# Patient Record
Sex: Male | Born: 1975 | Race: Black or African American | Hispanic: No | Marital: Single | Smoking: Former smoker
Health system: Southern US, Community
[De-identification: ages and names within clinical notes are randomized; demographics above are authoritative.]

## PROBLEM LIST (undated history)

## (undated) DIAGNOSIS — M549 Dorsalgia, unspecified: Secondary | ICD-10-CM

---

## 2014-04-23 ENCOUNTER — Encounter (HOSPITAL_COMMUNITY): Payer: Self-pay | Admitting: Emergency Medicine

## 2014-04-23 ENCOUNTER — Emergency Department (HOSPITAL_COMMUNITY)
Admission: EM | Admit: 2014-04-23 | Discharge: 2014-04-23 | Discharge status: Against Medical Advice | Payer: Medicaid Other | Attending: Emergency Medicine | Admitting: Emergency Medicine

## 2014-04-23 DIAGNOSIS — M545 Low back pain, unspecified: Secondary | ICD-10-CM | POA: Diagnosis present

## 2014-04-23 DIAGNOSIS — R209 Unspecified disturbances of skin sensation: Secondary | ICD-10-CM | POA: Insufficient documentation

## 2014-04-23 DIAGNOSIS — R369 Urethral discharge, unspecified: Secondary | ICD-10-CM | POA: Diagnosis not present

## 2014-04-23 DIAGNOSIS — J029 Acute pharyngitis, unspecified: Secondary | ICD-10-CM | POA: Insufficient documentation

## 2014-04-23 DIAGNOSIS — Z202 Contact with and (suspected) exposure to infections with a predominantly sexual mode of transmission: Secondary | ICD-10-CM | POA: Insufficient documentation

## 2014-04-23 DIAGNOSIS — R300 Dysuria: Secondary | ICD-10-CM | POA: Diagnosis not present

## 2014-04-23 DIAGNOSIS — M5430 Sciatica, unspecified side: Secondary | ICD-10-CM | POA: Insufficient documentation

## 2014-04-23 DIAGNOSIS — Z87891 Personal history of nicotine dependence: Secondary | ICD-10-CM | POA: Diagnosis not present

## 2014-04-23 DIAGNOSIS — R350 Frequency of micturition: Secondary | ICD-10-CM | POA: Diagnosis not present

## 2014-04-23 DIAGNOSIS — G8929 Other chronic pain: Secondary | ICD-10-CM | POA: Diagnosis not present

## 2014-04-23 DIAGNOSIS — D72829 Elevated white blood cell count, unspecified: Secondary | ICD-10-CM | POA: Insufficient documentation

## 2014-04-23 DIAGNOSIS — Z87828 Personal history of other (healed) physical injury and trauma: Secondary | ICD-10-CM | POA: Diagnosis not present

## 2014-04-23 DIAGNOSIS — M5431 Sciatica, right side: Secondary | ICD-10-CM

## 2014-04-23 LAB — HIV ANTIBODY (ROUTINE TESTING W REFLEX): HIV 1&2 Ab, 4th Generation: NONREACTIVE

## 2014-04-23 LAB — URINE MICROSCOPIC-ADD ON

## 2014-04-23 LAB — URINALYSIS, ROUTINE W REFLEX MICROSCOPIC
Bilirubin Urine: NEGATIVE
Glucose, UA: NEGATIVE mg/dL
Hgb urine dipstick: NEGATIVE
Ketones, ur: NEGATIVE mg/dL
Nitrite: NEGATIVE
PROTEIN: NEGATIVE mg/dL
Specific Gravity, Urine: 1.026 (ref 1.005–1.030)
Urobilinogen, UA: 1 mg/dL (ref 0.0–1.0)
pH: 5.5 (ref 5.0–8.0)

## 2014-04-23 LAB — RPR

## 2014-04-23 MED ORDER — AZITHROMYCIN 250 MG PO TABS
1000.0000 mg | ORAL_TABLET | Freq: Once | ORAL | Status: AC
  Administered 2014-04-23: 1000 mg via ORAL
  Filled 2014-04-23: qty 4

## 2014-04-23 MED ORDER — DOXYCYCLINE HYCLATE 100 MG PO CAPS: 100.0000 mg | ORAL_CAPSULE | Freq: Two times a day (BID) | ORAL | Status: DC

## 2014-04-23 MED ORDER — CEFTRIAXONE SODIUM 250 MG IJ SOLR
250.0000 mg | Freq: Once | INTRAMUSCULAR | Status: AC
  Administered 2014-04-23: 250 mg via INTRAMUSCULAR
  Filled 2014-04-23: qty 250

## 2014-04-23 MED ORDER — LIDOCAINE HCL (PF) 1 % IJ SOLN
INTRAMUSCULAR | Status: AC
  Administered 2014-04-23: 5 mL
  Filled 2014-04-23: qty 5

## 2014-04-23 NOTE — ED Notes (Signed)
Pt presents with lower back pain radiating down into his Right leg, pt states "all of my life" when asked how long has the back pain been going on. Pt also reports burning with urination a penile discharge x3 days. Pt ambulatory in triage with a steady gait

## 2014-04-23 NOTE — ED Notes (Signed)
Pt states he does not have time to stay for scan. Has to leave AMA.

## 2014-04-23 NOTE — ED Notes (Signed)
Pt left without papers.  Had to go.

## 2014-04-23 NOTE — Discharge Instructions (Signed)
Please call your doctor for a followup appointment within 24-48 hours. When you talk to your doctor please let them know that you were seen in the emergency department and have them acquire all of your records so that they can discuss the findings with you and formulate a treatment plan to fully care for your new and ongoing problems. Please call and set up an appointment with health department Please take antibiotics as prescribed Please report back to the ED to get an x-ray and further imaging performed Please continue to monitor symptoms closely if symptoms are to worsen or change (fever greater than 101, chills, chest pain, shortness of breath, difficulty breathing, stomach pain, nausea, vomiting, diarrhea, worsening or changes to pain pattern, fall, injury, loss of sensation, numbness, tingling, fall, injury, urinary and bowel incontinence) please report back to the ED immediately   Sexually Transmitted Disease A sexually transmitted disease (STD) is a disease or infection that may be passed (transmitted) from person to person, usually during sexual activity. This may happen by way of saliva, semen, blood, vaginal mucus, or urine. Common STDs include:   Gonorrhea.   Chlamydia.   Syphilis.   HIV and AIDS.   Genital herpes.   Hepatitis B and C.   Trichomonas.   Human papillomavirus (HPV).   Pubic lice.   Scabies.  Mites.  Bacterial vaginosis. WHAT ARE CAUSES OF STDs? An STD may be caused by bacteria, a virus, or parasites. STDs are often transmitted during sexual activity if one person is infected. However, they may also be transmitted through nonsexual means. STDs may be transmitted after:   Sexual intercourse with an infected person.   Sharing sex toys with an infected person.   Sharing needles with an infected person or using unclean piercing or tattoo needles.  Having intimate contact with the genitals, mouth, or rectal areas of an infected person.    Exposure to infected fluids during birth. WHAT ARE THE SIGNS AND SYMPTOMS OF STDs? Different STDs have different symptoms. Some people may not have any symptoms. If symptoms are present, they may include:   Painful or bloody urination.   Pain in the pelvis, abdomen, vagina, anus, throat, or eyes.   A skin rash, itching, or irritation.  Growths, ulcerations, blisters, or sores in the genital and anal areas.  Abnormal vaginal discharge with or without bad odor.   Penile discharge in men.   Fever.   Pain or bleeding during sexual intercourse.   Swollen glands in the groin area.   Yellow skin and eyes (jaundice). This is seen with hepatitis.   Swollen testicles.  Infertility.  Sores and blisters in the mouth. HOW ARE STDs DIAGNOSED? To make a diagnosis, your health care provider may:   Take a medical history.   Perform a physical exam.   Take a sample of any discharge to examine.  Swab the throat, cervix, opening to the penis, rectum, or vagina for testing.  Test a sample of your first morning urine.   Perform blood tests.   Perform a Pap test, if this applies.   Perform a colposcopy.   Perform a laparoscopy.  HOW ARE STDs TREATED? Treatment depends on the STD. Some STDs may be treated but not cured.   Chlamydia, gonorrhea, trichomonas, and syphilis can be cured with antibiotic medicine.   Genital herpes, hepatitis, and HIV can be treated, but not cured, with prescribed medicines. The medicines lessen symptoms.   Genital warts from HPV can be treated with medicine or  by freezing, burning (electrocautery), or surgery. Warts may come back.   HPV cannot be cured with medicine or surgery. However, abnormal areas may be removed from the cervix, vagina, or vulva.   If your diagnosis is confirmed, your recent sexual partners need treatment. This is true even if they are symptom-free or have a negative culture or evaluation. They should not  have sex until their health care providers say it is okay. HOW CAN I REDUCE MY RISK OF GETTING AN STD? Take these steps to reduce your risk of getting an STD:  Use latex condoms, dental dams, and water-soluble lubricants during sexual activity. Do not use petroleum jelly or oils.  Avoid having multiple sex partners.  Do not have sex with someone who has other sex partners.  Do not have sex with anyone you do not know or who is at high risk for an STD.  Avoid risky sex practices that can break your skin.  Do not have sex if you have open sores on your mouth or skin.  Avoid drinking too much alcohol or taking illegal drugs. Alcohol and drugs can affect your judgment and put you in a vulnerable position.  Avoid engaging in oral and anal sex acts.  Get vaccinated for HPV and hepatitis. If you have not received these vaccines in the past, talk to your health care provider about whether one or both might be right for you.   If you are at risk of being infected with HIV, it is recommended that you take a prescription medicine daily to prevent HIV infection. This is called pre-exposure prophylaxis (PrEP). You are considered at risk if:  You are a man who has sex with other men (MSM).  You are a heterosexual man or woman and are sexually active with more than one partner.  You take drugs by injection.  You are sexually active with a partner who has HIV.  Talk with your health care provider about whether you are at high risk of being infected with HIV. If you choose to begin PrEP, you should first be tested for HIV. You should then be tested every 3 months for as long as you are taking PrEP.  WHAT SHOULD I DO IF I THINK I HAVE AN STD?  See your health care provider.   Tell your sexual partner(s). They should be tested and treated for any STDs.  Do not have sex until your health care provider says it is okay. WHEN SHOULD I GET IMMEDIATE MEDICAL CARE? Contact your health care  provider right away if:   You have severe abdominal pain.  You are a man and notice swelling or pain in your testicles.  You are a woman and notice swelling or pain in your vagina. Document Released: 01/05/2003 Document Revised: 10/20/2013 Document Reviewed: 05/05/2013 [facility] Patient Information 2015 [place], [place]. This information is not intended to replace advice given to you by your health care provider. Make sure you discuss any questions you have with your health care provider.   Emergency Department Resource Guide 1) Find a Doctor and Pay Out of Pocket Although you won't have to find out who is covered by your insurance plan, it is a good idea to ask around and get recommendations. You will then need to call the office and see if the doctor you have chosen will accept you as a new patient and what types of options they offer for patients who are self-pay. Some doctors offer discounts or will set up payment  plans for their patients who do not have insurance, but you will need to ask so you aren't surprised when you get to your appointment.  2) Contact Your Local Health Department Not all health departments have doctors that can see patients for sick visits, but many do, so it is worth a call to see if yours does. If you don't know where your local health department is, you can check in your phone book. The CDC also has a tool to help you locate your state's health department, and many state websites also have listings of all of their local health departments.  3) Find a Walk-in Clinic If your illness is not likely to be very severe or complicated, you may want to try a walk in clinic. These are popping up all over the country in pharmacies, drugstores, and shopping centers. They're usually staffed by nurse practitioners or physician assistants that have been trained to treat common illnesses and complaints. They're usually fairly quick and inexpensive. However, if you have serious medical  issues or chronic medical problems, these are probably not your best option.  No Primary Care Doctor: - Call Health Connect at  [phone] - they can help you locate a primary care doctor that  accepts your insurance, provides certain services, etc. - Physician Referral Service- [phone]  Chronic Pain Problems: Organization         Address  Phone   Notes  [facility]  [phone] Patients need to be referred by their primary care doctor.   Medication Assistance: Organization         Address  Phone   Notes  [facility] Medication [facility] [address] [place], [place] [address] [phone] --Must be a resident of [place] -- Must have NO insurance coverage whatsoever (no Medicaid/ Medicare, etc.) -- The pt. MUST have a primary care doctor that directs their care regularly and follows them in the community   MedAssist  [phone]   [employer]  [phone]    Agencies that provide inexpensive medical care: Organization         Address  Phone   Notes  [facility]  [phone]   [facility]    [phone]   [facility] [address] [place], [place] [address] [phone]   [facility] [address], [place] [phone]   [facility]anned Parenthood    [phone]   [facility]    [phone]   Community Health and [facility]  [address], [place] Phone:  [phone], Fax:  [phone] Hours of Operation:  9 am - 6 pm, M-F.  Also accepts Medicaid/Medicare and self-pay.  [facility]  [address], [place] Phone: [phone], Fax: [phone]. Hours of Operation:  8:30 am - 5:30 pm, M-F.  Also accepts Medicaid and self-pay.  [facility] [place] [address], [place] Phone: [phone]   [facility] [address], [place], [place]  [phone], Ext. [phone] Mondays & Thursdays: 7-9 AM.  First 15 patients are seen on a first come, first serve basis.    Medicaid-accepting [facility] [place] Providers:  Organization         Address  Phone   Notes  [facility] [address], [place] [phone]) [phone] Also accepts self-pay  patients.  [facility] [address] Laurell Josephs [place], [place]  [phone]   [facility] [address], [place] [phone]   [facility] [address] [place] [phone]   Renaye Rakers [address], [place]   [phone] Only accepts [place] patients after they have their name applied to their card.   Self-Pay (no insurance) in [facility]:  Organization         Address  Phone   Notes  Sickle Cell Patients, [facility] [address] [place], [place] [phone]   [facility] [address] [place], [place] [phone]   [facility]  [address], [place] [phone]   [facility]/Dr. Osei-Bonsu  [address], [place] or [address], [place] [phone] Phone number for both [place] and [place] locations is the same.  Urgent Medical and [facility] [address], [place] [phone]   [facility] [address] [place] or [address] [phone]   [facility] [address], [place] [phone], phone; [phone], fax Sees patients 1st and 3rd Saturday of every month.  Must not qualify for public or private insurance (i.e. Medicaid, Medicare, [place] Health Choice, Veterans' Benefits)  Household income should be no more than 200% of the poverty level The clinic cannot treat you if you are pregnant or think you are pregnant  Sexually transmitted  diseases are not treated at the clinic.    Dental Care: Organization         Address  Phone  Notes  [facility] [address] [place], [place] [phone] Accepts children up to age 67 who are enrolled in [place] or [place] Health Choice; pregnant women with a Medicaid card; and children who have applied for Medicaid or [place] Health Choice, but were declined, whose parents can pay a reduced fee at time of service.  [facility]  [address], [place] [phone] Accepts children up to age 73 who are enrolled in [place] or [place] Health Choice; pregnant women with a Medicaid card; and children who have applied for Medicaid or [place] Health Choice, but were declined, whose parents can pay a reduced fee at time of service.  [facility]  [address] [place], [place] [phone] Patients are seen by appointment only. Walk-ins are not accepted. [facility] will see patients 17 years of age and older. Monday - Tuesday (8am-5pm) Most Wednesdays (8:30-5pm) $30 per visit, cash only  [facility] Adult Dental Access PROGRAM  [address], [facility] [phone] Patients are seen by appointment only. Walk-ins are not accepted. [facility] will see patients 29 years of age and older. One Wednesday Evening (Monthly: Volunteer Based).  $30 per visit, cash only  [employer]  [phone] for adults; Children under age 40, call Graduate Pediatric Dentistry at [phone]. Children aged 97-14, please call [phone] to request a pediatric application.  Dental services are provided in all areas of dental care including fillings, crowns and bridges, complete and partial dentures, implants, gum treatment, root canals, and extractions. Preventive care is also provided. Treatment is provided to both  adults and children. Patients are selected via a  lottery and there is often a waiting list.   [facility] [address], [place]  [phone] [email or web address]   [facility] [address], [place], [place] [phone], Ext. [phone] Second and Fourth Thursday of each month, opens at 6:30 AM; Clinic ends at 9 AM.  Patients are seen on a first-come first-served basis, and a limited number are seen during each clinic.   [facility]  [address], [place], [place] [phone]   Eligibility Requirements You must have lived in [place], [place], or [place] counties for at least the last three months.   You cannot be eligible for state or federal sponsored National Cityhealthcare insurance, including CIGNAVeterans Administration, [place]icaid, or [employer]Medicare.   You generally cannot be eligible for healthcare insurance through your employer.    How to apply: Eligibility screenings are held every Tuesday and Wednesday afternoon from 1:00 pm until 4:00 pm. You do not need an appointment for the interview!  [facility] [address], [place], [place] [phone]   [facility]  [phone]   [facility]  [phone]   [facility]  [phone]    Behavioral Health Resources in the Community: Intensive Outpatient Programs Organization         Address  Phone  Notes  [facility] [address], [place], [place] [phone]   [facility] [address], [place], [place] [phone]   [facility] [address], [place], [place]  [phone]   [facility] [address],  [place], [place] [phone] or [phone]   Substance Abuse Resources Organization         Address  Phone  Notes  [facility] Drug Services  [phone]   [facility]  [phone]   [facility]  [phone]   [facility]  [phone]   Residential &  Outpatient Substance Abuse Program  [phone]   Psychological Services Organization         Address  Phone  Notes  [facility]  [phone]   [facility]  [phone]- [phone]   [facility] [address], [place] [phone] or [phone]    Mobile Crisis Teams Organization         Address  Phone  Notes  [facility], Mobile Crisis Care Unit  [phone]   [facility]  [address] [place], [place] [phone]   Doristine LocksSharon DeEsch [address] [place] [phone]    Self-Help/Support Groups Organization         Address  Phone             Notes  [facility] [place] - variety of support groups  336- [phone] Call for more information  Narcotics Anonymous (NA), [facility] [address], [place]  2 meetings at this location   Statisticianesidential Treatment Programs Organization         Address  Phone  Notes  [facility] [address],    [place]  [phone]   [facility]  [address], [place] [phone], [place], [place] [phone]   [facility] [address], [place] [phone] Admissions: 8am-3pm M-F  [facility] [address]    [place], [place] [phone]  [facility] [address] Jadene Pierini [place], [place]   The [place].,  [place], [place]   [facility] - Intensive Outpatient [address], [place], [place]   [facility] [address]  [place], [place] [phone] or [phone]   [facility] (RTS) [address], [place], [place] Accepts Medicaid  [facility] Merkel [address]  [place] [phone] Substance Abuse/Addiction Treatment   [facility] Organization          Address  Phone  Notes  [facility]  [phone]   Domenic Schwab, PhD [address] Arlis Porta [place], [place]   [phone] or [phone]   [address] [place], [place] [phone]   [facility] [address] [place], [place] [phone] Insurance/Medicaid/sponsorship through [facility] and Families [address], [address]                                    [place], [place] [phone] [facility] [address] Street[place], [place] [phone]    Dr. Adele Schilder  [phone]   [facility]. 1) [address] [place] 2) [place] 3)  [address], [place] [phone]  [phone]   [place] [phone] or [phone] (After Hours)

## 2014-04-23 NOTE — Progress Notes (Signed)
Orthopedic Tech Progress Note Patient Details:  Angel AdeJoseph Chapman 03/18/1976 [id number]  Ortho Devices Type of Ortho Device: Ace wrap;Post (short leg) splint;Crutches Ortho Device/Splint Location: lle Ortho Device/Splint Interventions: Application As ordered by PA Jeral PinchSciacca  Angel Chapman, Angel Chapman 04/23/2014, 9:38 PM
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
## 2014-04-23 NOTE — ED Notes (Signed)
Pt reports "green" penile discharge. Also states flank pain and mild dysuria.

## 2014-04-24 LAB — GC/CHLAMYDIA PROBE AMP
CT Probe RNA: NEGATIVE
GC Probe RNA: NEGATIVE

## 2014-04-24 NOTE — ED Provider Notes (Signed)
Medical screening examination/treatment/procedure(s) were performed by non-physician practitioner and as supervising physician I was immediately available for consultation/collaboration.  Camillo Quadros L Delma Drone, MD 04/24/14 1556 

## 2014-08-30 ENCOUNTER — Encounter (HOSPITAL_COMMUNITY): Payer: Self-pay | Admitting: Emergency Medicine

## 2014-08-30 ENCOUNTER — Emergency Department (HOSPITAL_COMMUNITY)
Admission: EM | Admit: 2014-08-30 | Discharge: 2014-08-30 | Disposition: A | Discharge status: Home / Self Care | Payer: Medicaid Other | Attending: Emergency Medicine | Admitting: Emergency Medicine

## 2014-08-30 DIAGNOSIS — M5441 Lumbago with sciatica, right side: Secondary | ICD-10-CM | POA: Insufficient documentation

## 2014-08-30 DIAGNOSIS — R200 Anesthesia of skin: Secondary | ICD-10-CM | POA: Insufficient documentation

## 2014-08-30 DIAGNOSIS — Z87891 Personal history of nicotine dependence: Secondary | ICD-10-CM | POA: Insufficient documentation

## 2014-08-30 DIAGNOSIS — G8929 Other chronic pain: Secondary | ICD-10-CM | POA: Insufficient documentation

## 2014-08-30 DIAGNOSIS — Z792 Long term (current) use of antibiotics: Secondary | ICD-10-CM | POA: Insufficient documentation

## 2014-08-30 MED ORDER — TRAMADOL HCL 50 MG PO TABS: 50.0000 mg | ORAL_TABLET | Freq: Once | ORAL | Status: DC

## 2014-08-30 MED ORDER — PREDNISONE 20 MG PO TABS
40.0000 mg | ORAL_TABLET | Freq: Once | ORAL | Status: AC
  Administered 2014-08-30: 40 mg via ORAL
  Filled 2014-08-30: qty 2

## 2014-08-30 MED ORDER — PREDNISONE 20 MG PO TABS: 40.0000 mg | ORAL_TABLET | Freq: Every day | ORAL | Status: DC

## 2014-08-30 MED ORDER — HYDROCODONE-ACETAMINOPHEN 5-325 MG PO TABS
1.0000 | ORAL_TABLET | Freq: Once | ORAL | Status: AC
  Administered 2014-08-30: 1 via ORAL
  Filled 2014-08-30: qty 1

## 2014-08-30 MED ORDER — HYDROCODONE-ACETAMINOPHEN 5-325 MG PO TABS: 1.0000 | ORAL_TABLET | ORAL | Status: DC | PRN

## 2014-08-30 NOTE — ED Notes (Signed)
Declined W/C at D/C and was escorted to lobby by RN. 

## 2014-08-30 NOTE — Discharge Instructions (Signed)
Please follow directions provided. Be sure to call the referral provided for the orthopedic doctor to help manage your recurrent back pain.  Please take the prednisone daily as directed and you may take the vicodin for pain.  You may not drive while taking the vicodin.  Don't hesitate to return for any new, worsening, or concerning symptoms.   SEEK IMMEDIATE MEDICAL CARE IF:  You have pain that radiates from your back into your legs.  You develop new bowel or bladder control problems.  You have unusual weakness or numbness in your arms or legs.  You develop nausea or vomiting.  You develop abdominal pain.  You feel faint.

## 2014-08-30 NOTE — ED Notes (Signed)
Pt c/o lower back pain x 1 month that is chronic in nature

## 2014-08-30 NOTE — ED Provider Notes (Signed)
CSN: [id number]     Arrival date & time 08/30/14  [id number] History  This chart was scribed for non-physician practitioner, Harle BattiestElizabeth Heidee Audi, NP working with Juliet RudeNathan R. Rubin PayorPickering, MD by Greggory StallionKayla Andersen, ED scribe. This patient was seen in room TR10C/TR10C and the patient's care was started at 10:05 AM.   Chief Complaint  Patient presents with  . Back Pain   The history is provided by the patient. No language interpreter was used.    HPI Comments: Angel Chapman is a 38 y.o. male who presents to the Emergency Department complaining of chronic lower back pain. States this episode started one month ago and worsened 2 days ago. Reports intermittent numbness and tingling in his right leg. Certain movements worsen pain. Denies fever, nausea, emesis, bowel or bladder incontinence, saddle anesthesia. Denies history of IV drug use.   History reviewed. No pertinent past medical history. History reviewed. No pertinent past surgical history. History reviewed. No pertinent family history. History  Substance Use Topics  . Smoking status: Former Games developermoker  . Smokeless tobacco: Not on file  . Alcohol Use: No    Review of Systems  Constitutional: Negative for fever.  Gastrointestinal: Negative for nausea and vomiting.  Genitourinary:       Negative for bowel or bladder incontinence.  Musculoskeletal: Positive for back pain.  Neurological: Positive for numbness.  All other systems reviewed and are negative.  Allergies  Review of patient's allergies indicates no known allergies.  Home Medications   Prior to Admission medications   Medication Sig Start Date End Date Taking? Authorizing Provider  doxycycline (VIBRAMYCIN) 100 MG capsule Take 1 capsule (100 mg total) by mouth 2 (two) times daily. One po bid x 7 days 04/23/14   Marissa Sciacca, PA-C   BP 148/92 mmHg  Pulse 73  Temp(Src) 98.4 F (36.9 C) (Oral)  Resp 20  Ht 6' (1.829 m)  Wt 200 lb (90.719 kg)  BMI 27.12 kg/m2  SpO2 98%   Physical Exam   Constitutional: He is oriented to person, place, and time. He appears well-developed and well-nourished. No distress.  HENT:  Head: Normocephalic and atraumatic.  Eyes: Conjunctivae and EOM are normal.  Neck: Neck supple. No tracheal deviation present.  Cardiovascular: Normal rate.   Pulmonary/Chest: Effort normal. No respiratory distress.  Musculoskeletal: Normal range of motion.  Bilateral lumbar tenderness. 5/5 strength with straight leg raise and plantar and dorsiflexion.   Neurological: He is alert and oriented to person, place, and time. No cranial nerve deficit. Coordination normal.  Reports alteration in sensation to right lateral leg, appropriate 2 point discrimination  Skin: Skin is warm and dry.  Psychiatric: He has a normal mood and affect. His behavior is normal.  Nursing note and vitals reviewed.   ED Course  Procedures (including critical care time)  DIAGNOSTIC STUDIES: Oxygen Saturation is 98% on RA, normal by my interpretation.    COORDINATION OF CARE: 10:09 AM-Discussed treatment plan which includes an anti-inflammatory with pt at bedside and pt agreed to plan. Will give pt an orthopedic referral and advised him to follow up.   Labs Review Labs Reviewed - No data to display  Imaging Review No results found.   EKG Interpretation None      MDM   Final diagnoses:  Bilateral low back pain with right-sided sciatica   38 yo with back pain.  He has no neurological deficits and a normal neuro exam.  He does not have gait difficulty. He denies loss of bowel or bladder  control, no fever, night sweats, weight loss, h/o cancer, IVDU.   There is no concern for cauda equina.   Discharge instructions include RICE protocol, referral to ortho if pain persists and pain medicine indicated and discussed with patient. Pt aware of plan and in agreement. Return precautions provided.  I personally performed the services described in this documentation, which was scribed in my  presence. The recorded information has been reviewed and is accurate.  Filed Vitals:   08/30/14 0940  BP: 148/92  Pulse: 73  Temp: 98.4 F (36.9 C)  TempSrc: Oral  Resp: 20  Height: 6' (1.829 m)  Weight: 200 lb (90.719 kg)  SpO2: 98%   Meds given in ED:  Medications  predniSONE (DELTASONE) tablet 40 mg (not administered)  HYDROcodone-acetaminophen (NORCO/VICODIN) 5-325 MG per tablet 1 tablet (not administered)    New Prescriptions   HYDROCODONE-ACETAMINOPHEN (NORCO/VICODIN) 5-325 MG PER TABLET    Take 1-2 tablets by mouth every 4 (four) hours as needed for moderate pain or severe pain.   PREDNISONE (DELTASONE) 20 MG TABLET    Take 2 tablets (40 mg total) by mouth daily.      Harle BattiestElizabeth Ashayla Subia, NP 08/30/14 1825

## 2015-04-13 ENCOUNTER — Encounter (HOSPITAL_COMMUNITY): Payer: Self-pay | Admitting: Emergency Medicine

## 2015-04-13 ENCOUNTER — Emergency Department (HOSPITAL_COMMUNITY): Payer: No Typology Code available for payment source

## 2015-04-13 ENCOUNTER — Emergency Department (HOSPITAL_COMMUNITY)
Admission: EM | Admit: 2015-04-13 | Discharge: 2015-04-13 | Disposition: A | Discharge status: Home / Self Care | Payer: No Typology Code available for payment source | Attending: Emergency Medicine | Admitting: Emergency Medicine

## 2015-04-13 DIAGNOSIS — Y999 Unspecified external cause status: Secondary | ICD-10-CM | POA: Insufficient documentation

## 2015-04-13 DIAGNOSIS — S3992XA Unspecified injury of lower back, initial encounter: Secondary | ICD-10-CM | POA: Insufficient documentation

## 2015-04-13 DIAGNOSIS — Z87891 Personal history of nicotine dependence: Secondary | ICD-10-CM | POA: Insufficient documentation

## 2015-04-13 DIAGNOSIS — Y939 Activity, unspecified: Secondary | ICD-10-CM | POA: Insufficient documentation

## 2015-04-13 DIAGNOSIS — Y92410 Unspecified street and highway as the place of occurrence of the external cause: Secondary | ICD-10-CM | POA: Diagnosis not present

## 2015-04-13 DIAGNOSIS — S79911A Unspecified injury of right hip, initial encounter: Secondary | ICD-10-CM | POA: Insufficient documentation

## 2015-04-13 DIAGNOSIS — G8929 Other chronic pain: Secondary | ICD-10-CM | POA: Insufficient documentation

## 2015-04-13 DIAGNOSIS — M5416 Radiculopathy, lumbar region: Secondary | ICD-10-CM

## 2015-04-13 MED ORDER — OXYCODONE-ACETAMINOPHEN 5-325 MG PO TABS: 1.0000 | ORAL_TABLET | Freq: Four times a day (QID) | ORAL | Status: AC | PRN

## 2015-04-13 MED ORDER — HYDROMORPHONE HCL 1 MG/ML IJ SOLN
1.0000 mg | Freq: Once | INTRAMUSCULAR | Status: AC
  Administered 2015-04-13: 1 mg via INTRAVENOUS
  Filled 2015-04-13: qty 1

## 2015-04-13 MED ORDER — CYCLOBENZAPRINE HCL 5 MG PO TABS: 5.0000 mg | ORAL_TABLET | Freq: Three times a day (TID) | ORAL | Status: DC | PRN
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
## 2015-04-13 NOTE — ED Notes (Signed)
While walking pt he begin to complain of pain in lower back and leg with foot numbness,pt was only able to walk about 15 feet.
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
## 2017-09-17 ENCOUNTER — Emergency Department (HOSPITAL_COMMUNITY)
Admission: EM | Admit: 2017-09-17 | Discharge: 2017-09-17 | Disposition: A | Discharge status: Home / Self Care | Payer: Self-pay | Attending: Emergency Medicine | Admitting: Emergency Medicine

## 2017-09-17 ENCOUNTER — Encounter (HOSPITAL_COMMUNITY): Payer: Self-pay | Admitting: Emergency Medicine

## 2017-09-17 ENCOUNTER — Emergency Department (HOSPITAL_COMMUNITY): Payer: Self-pay

## 2017-09-17 DIAGNOSIS — Z87891 Personal history of nicotine dependence: Secondary | ICD-10-CM | POA: Insufficient documentation

## 2017-09-17 DIAGNOSIS — G8929 Other chronic pain: Secondary | ICD-10-CM

## 2017-09-17 DIAGNOSIS — M545 Low back pain: Secondary | ICD-10-CM | POA: Insufficient documentation

## 2017-09-17 MED ORDER — CYCLOBENZAPRINE HCL 10 MG PO TABS: 10.0000 mg | ORAL_TABLET | Freq: Two times a day (BID) | ORAL | 0 refills | Status: AC | PRN

## 2017-09-17 MED ORDER — HYDROCODONE-ACETAMINOPHEN 5-325 MG PO TABS
1.0000 | ORAL_TABLET | Freq: Once | ORAL | Status: AC
  Administered 2017-09-17: 1 via ORAL
  Filled 2017-09-17: qty 1

## 2017-09-17 NOTE — ED Provider Notes (Signed)
[facility]-EMERGENCY DEPT Provider Note   CSN: [id number] Arrival date & time: 09/17/17  [id number]     History   Chief Complaint Chief Complaint  Patient presents with  . Back Pain    HPI  Angel Chapman is a 41 y.o. male with past medical history of chronic back pain, presented to the ED with worsening back pain since yesterday.  Patient states he was lifting a heavy box at work, and "tweaked" his back.  Localizes pain to the lower back, that is worse with movement.  No medications tried prior to arrival.  He states he was receiving yearly injections in his spine for chronic back pain and states he is about due for another.  Reports intermittent tingling sensations down the right anterior leg, no bowel or bladder incontinence, no weakness, no fever, no history of IV drug use.  The history is provided by the patient.    History reviewed. No pertinent past medical history.  There are no active problems to display for this patient.   History reviewed. No pertinent surgical history.     Home Medications    Prior to Admission medications   Medication Sig Start Date End Date Taking? Authorizing Provider  cyclobenzaprine (FLEXERIL) 10 MG tablet Take 1 tablet (10 mg total) 2 (two) times daily as needed by mouth for muscle spasms. 09/17/17   Laci Frenkel, [place] N, PA-C  oxyCODONE-acetaminophen (PERCOCET/ROXICET) 5-325 MG per tablet Take 1 tablet by mouth every 6 (six) hours as needed for severe pain. 04/13/15   Tilden Fossaees, Elizabeth, MD    Family History No family history on file.  Social History Social History   Tobacco Use  . Smoking status: Former Games developermoker  . Smokeless tobacco: Never Used  Substance Use Topics  . Alcohol use: No  . Drug use: Yes    Types: Marijuana     Allergies   Patient has no known allergies.   Review of Systems Review of Systems  Constitutional: Negative for fever.  Gastrointestinal:       No bowel incontinence  Genitourinary:  Negative for difficulty urinating.  Musculoskeletal: Positive for back pain.  Neurological: Negative for weakness and numbness.     Physical Exam Updated Vital Signs BP (!) 137/93 (BP Location: Right Arm)   Pulse 75   Temp 97.9 F (36.6 C) (Oral)   Resp 16   Ht 5\' 11"  (1.803 m)   Wt 89.8 kg (198 lb)   SpO2 100%   BMI 27.62 kg/m   Physical Exam  Constitutional: He appears well-developed and well-nourished.  HENT:  Head: Normocephalic and atraumatic.  Eyes: Conjunctivae are normal.  Neck: Normal range of motion. Neck supple.  Cardiovascular: Normal rate, regular rhythm, normal heart sounds and intact distal pulses.  Pulmonary/Chest: Effort normal and breath sounds normal.  Abdominal: Soft. Bowel sounds are normal. There is no tenderness.  Musculoskeletal:  Midline L-spine tenderness, no bony step-offs, no gross deformities. No C or T spine or paraspinal tenderness. Moving all extremities. Neg straight leg raise  Neurological:  Motor:  Normal tone. 5/5 in upper and lower extremities bilaterally including strong and equal grip strength and dorsiflexion/plantar flexion Sensory: Pinprick and light touch normal in all extremities.  Deep Tendon Reflexes: 2+ and symmetric in the biceps and patella Gait: normal gait and balance CV: distal pulses palpable throughout    Psychiatric: He has a normal mood and affect. His behavior is normal.  Nursing note and vitals reviewed.    ED Treatments / Results  Labs (all labs ordered are listed, but only abnormal results are displayed) Labs Reviewed - No data to display  EKG  EKG Interpretation None       Radiology Dg Lumbar Spine Complete  Result Date: 09/17/2017 CLINICAL DATA:  Worsening of pain since a bending and lifting episode at work yesterday. The patient reports some symptoms radiating into the right leg and foot. EXAM: LUMBAR SPINE - COMPLETE 4+ VIEW COMPARISON:  CT scan lumbar spine of April 13, 2015 FINDINGS: The lumbar  vertebral bodies are preserved in height. There is mild disc space narrowing at L4-5 and moderate narrowing at L5-S1 with a vacuum phenomenon. These findings are stable. There is facet joint hypertrophy at L5-S1. There is no spondylolisthesis. The pedicles and transverse processes are intact. The observed portions of the sacrum are normal. IMPRESSION: Degenerative disc disease at L5-S1 and to a lesser extent at L4-5. No acute compression fracture nor spondylolisthesis. Degenerative facet joint change at L5-S1. Electronically Signed   By: David  [place] M.D.   On: 09/17/2017 09:26    Procedures Procedures (including critical care time)  Medications Ordered in ED Medications  HYDROcodone-acetaminophen (NORCO/VICODIN) 5-325 MG per tablet 1 tablet (1 tablet Oral Given 09/17/17 [id number])     Initial Impression / Assessment and Plan / ED Course  I have reviewed the triage vital signs and the nursing notes.  Pertinent labs & imaging results that were available during my care of the patient were reviewed by me and considered in my medical decision making (see chart for details).     Patient with acute on chronic back pain.  L-spine xray with degenerative changes, no acute pathology. No neurological deficits and normal neuro exam.  Patient can walk but states is painful.  No loss of bowel or bladder control.  No concern for cauda equina.  RICE protocol and pain medicine indicated and discussed with patient.   Discussed results, findings, treatment and follow up. Patient advised of return precautions. Patient verbalized understanding and agreed with plan.  Final Clinical Impressions(s) / ED Diagnoses   Final diagnoses:  Chronic bilateral low back pain without sciatica    ED Discharge Orders        Ordered    cyclobenzaprine (FLEXERIL) 10 MG tablet  2 times daily PRN     09/17/17 0933       Husain Costabile, [place] N, PA-C 09/17/17 [id number]    Azalia Bilisampos, Kevin, MD 09/18/17 [phone]

## 2017-09-17 NOTE — Discharge Instructions (Signed)
Please read instructions below. Talk with your PCP about any new medications. You can take aleve every 12 hours as needed for pain. You can take flexeril every 12 hours as needed for muscle spasm. Apply ice to your back for 20 minutes at a time. Avoid heavy lifting for the next few days. Return to ER if new numbness or tingling in your arms or legs, inability to urinate, inability to hold your bowels, or weakness in your extremities.

## 2017-09-17 NOTE — ED Triage Notes (Signed)
Patient c/o lifting boxes yesterday at work and started having mid to lower back pain as well as pain on left side of neck. Denies any falls.

## 2017-12-19 ENCOUNTER — Emergency Department (HOSPITAL_COMMUNITY): Payer: BLUE CROSS/BLUE SHIELD

## 2017-12-19 ENCOUNTER — Encounter (HOSPITAL_COMMUNITY): Payer: Self-pay | Admitting: Emergency Medicine

## 2017-12-19 ENCOUNTER — Emergency Department (HOSPITAL_COMMUNITY)
Admission: EM | Admit: 2017-12-19 | Discharge: 2017-12-19 | Disposition: A | Discharge status: Home / Self Care | Payer: BLUE CROSS/BLUE SHIELD | Attending: Emergency Medicine | Admitting: Emergency Medicine

## 2017-12-19 DIAGNOSIS — Y9389 Activity, other specified: Secondary | ICD-10-CM | POA: Diagnosis not present

## 2017-12-19 DIAGNOSIS — Z87891 Personal history of nicotine dependence: Secondary | ICD-10-CM | POA: Diagnosis not present

## 2017-12-19 DIAGNOSIS — S299XXA Unspecified injury of thorax, initial encounter: Secondary | ICD-10-CM | POA: Diagnosis not present

## 2017-12-19 DIAGNOSIS — S298XXA Other specified injuries of thorax, initial encounter: Secondary | ICD-10-CM

## 2017-12-19 DIAGNOSIS — Y929 Unspecified place or not applicable: Secondary | ICD-10-CM | POA: Insufficient documentation

## 2017-12-19 DIAGNOSIS — Y999 Unspecified external cause status: Secondary | ICD-10-CM | POA: Diagnosis not present

## 2017-12-19 DIAGNOSIS — S39012A Strain of muscle, fascia and tendon of lower back, initial encounter: Secondary | ICD-10-CM | POA: Diagnosis not present

## 2017-12-19 MED ORDER — HYDROCODONE-ACETAMINOPHEN 5-325 MG PO TABS
1.0000 | ORAL_TABLET | Freq: Once | ORAL | Status: AC
  Administered 2017-12-19: 1 via ORAL
  Filled 2017-12-19: qty 1
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
## 2017-12-21 ENCOUNTER — Encounter (HOSPITAL_COMMUNITY): Payer: Self-pay | Admitting: Emergency Medicine

## 2017-12-21 ENCOUNTER — Other Ambulatory Visit: Payer: Self-pay

## 2017-12-21 ENCOUNTER — Emergency Department (HOSPITAL_COMMUNITY)
Admission: EM | Admit: 2017-12-21 | Discharge: 2017-12-21 | Disposition: A | Discharge status: Home / Self Care | Payer: BLUE CROSS/BLUE SHIELD | Attending: Emergency Medicine | Admitting: Emergency Medicine

## 2017-12-21 DIAGNOSIS — M545 Low back pain: Secondary | ICD-10-CM

## 2017-12-21 DIAGNOSIS — Y9389 Activity, other specified: Secondary | ICD-10-CM | POA: Diagnosis not present

## 2017-12-21 DIAGNOSIS — Y92410 Unspecified street and highway as the place of occurrence of the external cause: Secondary | ICD-10-CM | POA: Diagnosis not present

## 2017-12-21 DIAGNOSIS — I10 Essential (primary) hypertension: Secondary | ICD-10-CM

## 2017-12-21 DIAGNOSIS — Y999 Unspecified external cause status: Secondary | ICD-10-CM | POA: Insufficient documentation

## 2017-12-21 DIAGNOSIS — Z87891 Personal history of nicotine dependence: Secondary | ICD-10-CM | POA: Diagnosis not present

## 2017-12-21 HISTORY — DX: Dorsalgia, unspecified: M54.9

## 2017-12-21 MED ORDER — KETOROLAC TROMETHAMINE 60 MG/2ML IM SOLN
15.0000 mg | Freq: Once | INTRAMUSCULAR | Status: AC
  Administered 2017-12-21: 15 mg via INTRAMUSCULAR
  Filled 2017-12-21: qty 2
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
## 2017-12-21 NOTE — ED Triage Notes (Signed)
Pt. Stated, Im having back pain and leg numbness more than usual

## 2017-12-21 NOTE — Discharge Instructions (Signed)
Please continue to take anti-inflammatories as discussed.  Follow-up with your primary care doctor today as you were noted to have high blood pressure.  You will need to have this rechecked and possibly start on medications for this.  Return to the emergency department immediately if you experience any chest pain, shortness of breath, headaches, decreased urinary output, back pain associated with fevers, loss of control of your bowels/bladder, weakness/numbness to your legs, numbness to your groin area, inability to walk, or inability to urinate.
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
